# Patient Record
Sex: Male | Born: 1995 | Race: White | Hispanic: No | Marital: Single | Smoking: Never smoker
Health system: Southern US, Community
[De-identification: ages and names within clinical notes are randomized; demographics above are authoritative.]

## PROBLEM LIST (undated history)

## (undated) DIAGNOSIS — I471 Supraventricular tachycardia, unspecified: Secondary | ICD-10-CM

## (undated) DIAGNOSIS — F419 Anxiety disorder, unspecified: Secondary | ICD-10-CM

## (undated) DIAGNOSIS — I10 Essential (primary) hypertension: Secondary | ICD-10-CM

## (undated) HISTORY — PX: TONSILLECTOMY: SUR1361

## (undated) HISTORY — PX: APPENDECTOMY: SHX54

---
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
## 2015-09-27 ENCOUNTER — Emergency Department (HOSPITAL_COMMUNITY): Payer: BLUE CROSS/BLUE SHIELD

## 2015-09-27 ENCOUNTER — Emergency Department (HOSPITAL_COMMUNITY)
Admission: EM | Admit: 2015-09-27 | Discharge: 2015-09-27 | Disposition: A | Discharge status: Home / Self Care | Payer: BLUE CROSS/BLUE SHIELD | Attending: Emergency Medicine | Admitting: Emergency Medicine

## 2015-09-27 ENCOUNTER — Encounter (HOSPITAL_COMMUNITY): Payer: Self-pay | Admitting: *Deleted

## 2015-09-27 DIAGNOSIS — S0081XA Abrasion of other part of head, initial encounter: Secondary | ICD-10-CM | POA: Diagnosis not present

## 2015-09-27 DIAGNOSIS — S0990XA Unspecified injury of head, initial encounter: Secondary | ICD-10-CM | POA: Diagnosis present

## 2015-09-27 DIAGNOSIS — S8992XA Unspecified injury of left lower leg, initial encounter: Secondary | ICD-10-CM | POA: Insufficient documentation

## 2015-09-27 DIAGNOSIS — Y92410 Unspecified street and highway as the place of occurrence of the external cause: Secondary | ICD-10-CM | POA: Insufficient documentation

## 2015-09-27 DIAGNOSIS — Z8659 Personal history of other mental and behavioral disorders: Secondary | ICD-10-CM | POA: Insufficient documentation

## 2015-09-27 DIAGNOSIS — Z91040 Latex allergy status: Secondary | ICD-10-CM | POA: Diagnosis not present

## 2015-09-27 DIAGNOSIS — I10 Essential (primary) hypertension: Secondary | ICD-10-CM | POA: Insufficient documentation

## 2015-09-27 DIAGNOSIS — Y9389 Activity, other specified: Secondary | ICD-10-CM | POA: Diagnosis not present

## 2015-09-27 DIAGNOSIS — S1081XA Abrasion of other specified part of neck, initial encounter: Secondary | ICD-10-CM | POA: Insufficient documentation

## 2015-09-27 DIAGNOSIS — S060X1A Concussion with loss of consciousness of 30 minutes or less, initial encounter: Secondary | ICD-10-CM | POA: Diagnosis not present

## 2015-09-27 DIAGNOSIS — Y998 Other external cause status: Secondary | ICD-10-CM | POA: Diagnosis not present

## 2015-09-27 HISTORY — DX: Essential (primary) hypertension: I10

## 2015-09-27 HISTORY — DX: Anxiety disorder, unspecified: F41.9

## 2015-09-27 LAB — CBC WITH DIFFERENTIAL/PLATELET
Basophils Absolute: 0 10*3/uL (ref 0.0–0.1)
Basophils Relative: 0 %
EOS ABS: 0.1 10*3/uL (ref 0.0–0.7)
Eosinophils Relative: 1 %
HCT: 42.1 % (ref 39.0–52.0)
Hemoglobin: 14.8 g/dL (ref 13.0–17.0)
LYMPHS ABS: 2.1 10*3/uL (ref 0.7–4.0)
Lymphocytes Relative: 23 %
MCH: 32.5 pg (ref 26.0–34.0)
MCHC: 35.2 g/dL (ref 30.0–36.0)
MCV: 92.5 fL (ref 78.0–100.0)
MONOS PCT: 6 %
Monocytes Absolute: 0.5 10*3/uL (ref 0.1–1.0)
NEUTROS ABS: 6.3 10*3/uL (ref 1.7–7.7)
NEUTROS PCT: 70 %
PLATELETS: 242 10*3/uL (ref 150–400)
RBC: 4.55 MIL/uL (ref 4.22–5.81)
RDW: 11.9 % (ref 11.5–15.5)
WBC: 9 10*3/uL (ref 4.0–10.5)

## 2015-09-27 LAB — URINALYSIS, ROUTINE W REFLEX MICROSCOPIC
Bilirubin Urine: NEGATIVE
GLUCOSE, UA: NEGATIVE mg/dL
Hgb urine dipstick: NEGATIVE
KETONES UR: NEGATIVE mg/dL
LEUKOCYTES UA: NEGATIVE
Nitrite: NEGATIVE
PROTEIN: NEGATIVE mg/dL
Specific Gravity, Urine: 1.01 (ref 1.005–1.030)
pH: 6 (ref 5.0–8.0)

## 2015-09-27 LAB — COMPREHENSIVE METABOLIC PANEL
ALBUMIN: 4.1 g/dL (ref 3.5–5.0)
ALK PHOS: 51 U/L (ref 38–126)
ALT: 21 U/L (ref 17–63)
ANION GAP: 10 (ref 5–15)
AST: 24 U/L (ref 15–41)
BUN: 17 mg/dL (ref 6–20)
CHLORIDE: 103 mmol/L (ref 101–111)
CO2: 28 mmol/L (ref 22–32)
Calcium: 9.4 mg/dL (ref 8.9–10.3)
Creatinine, Ser: 1.22 mg/dL (ref 0.61–1.24)
GFR calc non Af Amer: >60 mL/min (ref >60)
GLUCOSE: 113 mg/dL — AB (ref 65–99)
POTASSIUM: 4.1 mmol/L (ref 3.5–5.1)
SODIUM: 141 mmol/L (ref 135–145)
Total Bilirubin: 0.5 mg/dL (ref 0.3–1.2)
Total Protein: 6.8 g/dL (ref 6.5–8.1)

## 2015-09-27 LAB — LIPASE, BLOOD: Lipase: 29 U/L (ref 11–51)

## 2015-09-27 MED ORDER — ONDANSETRON 4 MG PO TBDP: 4.0000 mg | ORAL_TABLET | Freq: Three times a day (TID) | ORAL | Status: AC | PRN

## 2015-09-27 MED ORDER — ACETAMINOPHEN 325 MG PO TABS
650.0000 mg | ORAL_TABLET | Freq: Four times a day (QID) | ORAL | Status: DC | PRN
  Administered 2015-09-27: 650 mg via ORAL
  Filled 2015-09-27: qty 2

## 2015-09-27 MED ORDER — ONDANSETRON HCL 4 MG/2ML IJ SOLN
4.0000 mg | Freq: Once | INTRAMUSCULAR | Status: AC
  Administered 2015-09-27: 4 mg via INTRAVENOUS
  Filled 2015-09-27: qty 2

## 2015-09-27 MED ORDER — HYDROCODONE-ACETAMINOPHEN 5-325 MG PO TABS: 1.0000 | ORAL_TABLET | Freq: Once | ORAL | Status: DC
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
## 2015-09-27 NOTE — ED Notes (Signed)
Dr. Little at the bedside. 

## 2015-09-27 NOTE — ED Notes (Signed)
Dr Clarene Duke removed C Collar

## 2015-09-27 NOTE — ED Notes (Signed)
Discharge instructions and prescription reviewed - voiced understanding.  

## 2015-09-27 NOTE — Discharge Instructions (Signed)
Concussion, Adult  A concussion, or closed-head injury, is a brain injury caused by a direct blow to the head or by a quick and sudden movement (jolt) of the head or neck. Concussions are usually not life-threatening. Even so, the effects of a concussion can be serious. If you have had a concussion before, you are more likely to experience concussion-like symptoms after a direct blow to the head.   CAUSES  · Direct blow to the head, such as from running into another player during a soccer game, being hit in a fight, or hitting your head on a hard surface.  · A jolt of the head or neck that causes the brain to move back and forth inside the skull, such as in a car crash.  SIGNS AND SYMPTOMS  The signs of a concussion can be hard to notice. Early on, they may be missed by you, family members, and health care providers. You may look fine but act or feel differently.  Symptoms are usually temporary, but they may last for days, weeks, or even longer. Some symptoms may appear right away while others may not show up for hours or days. Every head injury is different. Symptoms include:  · Mild to moderate headaches that will not go away.  · A feeling of pressure inside your head.  · Having more trouble than usual:    Learning or remembering things you have heard.    Answering questions.    Paying attention or concentrating.    Organizing daily tasks.    Making decisions and solving problems.  · Slowness in thinking, acting or reacting, speaking, or reading.  · Getting lost or being easily confused.  · Feeling tired all the time or lacking energy (fatigued).  · Feeling drowsy.  · Sleep disturbances.    Sleeping more than usual.    Sleeping less than usual.    Trouble falling asleep.    Trouble sleeping (insomnia).  · Loss of balance or feeling lightheaded or dizzy.  · Nausea or vomiting.  · Numbness or tingling.  · Increased sensitivity to:    Sounds.    Lights.    Distractions.  · Vision problems or eyes that tire  easily.  · Diminished sense of taste or smell.  · Ringing in the ears.  · Mood changes such as feeling sad or anxious.  · Becoming easily irritated or angry for Cimone Fahey or no reason.  · Lack of motivation.  · Seeing or hearing things other people do not see or hear (hallucinations).  DIAGNOSIS  Your health care provider can usually diagnose a concussion based on a description of your injury and symptoms. He or she will ask whether you passed out (lost consciousness) and whether you are having trouble remembering events that happened right before and during your injury.  Your evaluation might include:  · A brain scan to look for signs of injury to the brain. Even if the test shows no injury, you may still have a concussion.  · Blood tests to be sure other problems are not present.  TREATMENT  · Concussions are usually treated in an emergency department, in urgent care, or at a clinic. You may need to stay in the hospital overnight for further treatment.  · Tell your health care provider if you are taking any medicines, including prescription medicines, over-the-counter medicines, and natural remedies. Some medicines, such as blood thinners (anticoagulants) and aspirin, may increase the chance of complications. Also tell your health care   provider whether you have had alcohol or are taking illegal drugs. This information may affect treatment.  · Your health care provider will send you home with important instructions to follow.  · How fast you will recover from a concussion depends on many factors. These factors include how severe your concussion is, what part of your brain was injured, your age, and how healthy you were before the concussion.  · Most people with mild injuries recover fully. Recovery can take time. In general, recovery is slower in older persons. Also, persons who have had a concussion in the past or have other medical problems may find that it takes longer to recover from their current injury.  HOME  CARE INSTRUCTIONS  General Instructions  · Carefully follow the directions your health care provider gave you.  · Only take over-the-counter or prescription medicines for pain, discomfort, or fever as directed by your health care provider.  · Take only those medicines that your health care provider has approved.  · Do not drink alcohol until your health care provider says you are well enough to do so. Alcohol and certain other drugs may slow your recovery and can put you at risk of further injury.  · If it is harder than usual to remember things, write them down.  · If you are easily distracted, try to do one thing at a time. For example, do not try to watch TV while fixing dinner.  · Talk with family members or close friends when making important decisions.  · Keep all follow-up appointments. Repeated evaluation of your symptoms is recommended for your recovery.  · Watch your symptoms and tell others to do the same. Complications sometimes occur after a concussion. Older adults with a brain injury may have a higher risk of serious complications, such as a blood clot on the brain.  · Tell your teachers, school nurse, school counselor, coach, athletic trainer, or work manager about your injury, symptoms, and restrictions. Tell them about what you can or cannot do. They should watch for:    Increased problems with attention or concentration.    Increased difficulty remembering or learning new information.    Increased time needed to complete tasks or assignments.    Increased irritability or decreased ability to cope with stress.    Increased symptoms.  · Rest. Rest helps the brain to heal. Make sure you:    Get plenty of sleep at night. Avoid staying up late at night.    Keep the same bedtime hours on weekends and weekdays.    Rest during the day. Take daytime naps or rest breaks when you feel tired.  · Limit activities that require a lot of thought or concentration. These include:    Doing homework or job-related  work.    Watching TV.    Working on the computer.  · Avoid any situation where there is potential for another head injury (football, hockey, soccer, basketball, martial arts, downhill snow sports and horseback riding). Your condition will get worse every time you experience a concussion. You should avoid these activities until you are evaluated by the appropriate follow-up health care providers.  Returning To Your Regular Activities  You will need to return to your normal activities slowly, not all at once. You must give your body and brain enough time for recovery.  · Do not return to sports or other athletic activities until your health care provider tells you it is safe to do so.  · Ask   your health care provider when you can drive, ride a bicycle, or operate heavy machinery. Your ability to react may be slower after a brain injury. Never do these activities if you are dizzy.  · Ask your health care provider about when you can return to work or school.  Preventing Another Concussion  It is very important to avoid another brain injury, especially before you have recovered. In rare cases, another injury can lead to permanent brain damage, brain swelling, or death. The risk of this is greatest during the first 7-10 days after a head injury. Avoid injuries by:  · Wearing a seat belt when riding in a car.  · Drinking alcohol only in moderation.  · Wearing a helmet when biking, skiing, skateboarding, skating, or doing similar activities.  · Avoiding activities that could lead to a second concussion, such as contact or recreational sports, until your health care provider says it is okay.  · Taking safety measures in your home.    Remove clutter and tripping hazards from floors and stairways.    Use grab bars in bathrooms and handrails by stairs.    Place non-slip mats on floors and in bathtubs.    Improve lighting in dim areas.  SEEK MEDICAL CARE IF:  · You have increased problems paying attention or  concentrating.  · You have increased difficulty remembering or learning new information.  · You need more time to complete tasks or assignments than before.  · You have increased irritability or decreased ability to cope with stress.  · You have more symptoms than before.  Seek medical care if you have any of the following symptoms for more than 2 weeks after your injury:  · Lasting (chronic) headaches.  · Dizziness or balance problems.  · Nausea.  · Vision problems.  · Increased sensitivity to noise or light.  · Depression or mood swings.  · Anxiety or irritability.  · Memory problems.  · Difficulty concentrating or paying attention.  · Sleep problems.  · Feeling tired all the time.  SEEK IMMEDIATE MEDICAL CARE IF:  · You have severe or worsening headaches. These may be a sign of a blood clot in the brain.  · You have weakness (even if only in one hand, leg, or part of the face).  · You have numbness.  · You have decreased coordination.  · You vomit repeatedly.  · You have increased sleepiness.  · One pupil is larger than the other.  · You have convulsions.  · You have slurred speech.  · You have increased confusion. This may be a sign of a blood clot in the brain.  · You have increased restlessness, agitation, or irritability.  · You are unable to recognize people or places.  · You have neck pain.  · It is difficult to wake you up.  · You have unusual behavior changes.  · You lose consciousness.  MAKE SURE YOU:  · Understand these instructions.  · Will watch your condition.  · Will get help right away if you are not doing well or get worse.     This information is not intended to replace advice given to you by your health care provider. Make sure you discuss any questions you have with your health care provider.     Document Released: 10/27/2003 Document Revised: 08/27/2014 Document Reviewed: 02/26/2013  Elsevier Interactive Patient Education ©2016 [employer]

## 2015-10-03 ENCOUNTER — Emergency Department (HOSPITAL_COMMUNITY): Admission: EM | Admit: 2015-10-03 | Payer: No Typology Code available for payment source | Source: Home / Self Care

## 2015-10-03 NOTE — ED Notes (Signed)
Patient's mother states that due to extended wait times she will take him elsewhere
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
## 2018-04-06 ENCOUNTER — Other Ambulatory Visit: Payer: Self-pay

## 2018-04-06 ENCOUNTER — Emergency Department (HOSPITAL_BASED_OUTPATIENT_CLINIC_OR_DEPARTMENT_OTHER)
Admission: EM | Admit: 2018-04-06 | Discharge: 2018-04-06 | Disposition: A | Discharge status: Home / Self Care | Payer: BC Managed Care – PPO | Attending: Emergency Medicine | Admitting: Emergency Medicine

## 2018-04-06 ENCOUNTER — Encounter (HOSPITAL_BASED_OUTPATIENT_CLINIC_OR_DEPARTMENT_OTHER): Payer: Self-pay | Admitting: *Deleted

## 2018-04-06 DIAGNOSIS — R5383 Other fatigue: Secondary | ICD-10-CM | POA: Insufficient documentation

## 2018-04-06 DIAGNOSIS — R0602 Shortness of breath: Secondary | ICD-10-CM | POA: Insufficient documentation

## 2018-04-06 DIAGNOSIS — I10 Essential (primary) hypertension: Secondary | ICD-10-CM | POA: Insufficient documentation

## 2018-04-06 DIAGNOSIS — R002 Palpitations: Secondary | ICD-10-CM | POA: Insufficient documentation

## 2018-04-06 HISTORY — DX: Supraventricular tachycardia: I47.1

## 2018-04-06 HISTORY — DX: Supraventricular tachycardia, unspecified: I47.10

## 2018-04-06 LAB — BASIC METABOLIC PANEL
Anion gap: 10 (ref 5–15)
BUN: 20 mg/dL (ref 6–20)
CALCIUM: 9.2 mg/dL (ref 8.9–10.3)
CO2: 26 mmol/L (ref 22–32)
CREATININE: 1.02 mg/dL (ref 0.61–1.24)
Chloride: 104 mmol/L (ref 98–111)
GFR calc non Af Amer: >60 mL/min (ref >60)
GLUCOSE: 95 mg/dL (ref 70–99)
Potassium: 3.5 mmol/L (ref 3.5–5.1)
Sodium: 140 mmol/L (ref 135–145)

## 2018-04-06 LAB — CBC
HEMATOCRIT: 41.1 % (ref 39.0–52.0)
Hemoglobin: 14.8 g/dL (ref 13.0–17.0)
MCH: 32.8 pg (ref 26.0–34.0)
MCHC: 36 g/dL (ref 30.0–36.0)
MCV: 91.1 fL (ref 78.0–100.0)
Platelets: 282 10*3/uL (ref 150–400)
RBC: 4.51 MIL/uL (ref 4.22–5.81)
RDW: 11.6 % (ref 11.5–15.5)
WBC: 9.5 10*3/uL (ref 4.0–10.5)

## 2018-04-06 LAB — TROPONIN I: Troponin I: <0.03 ng/mL (ref <0.03)

## 2018-04-06 MED ORDER — METOPROLOL SUCCINATE ER 25 MG PO TB24: 25.0000 mg | ORAL_TABLET | Freq: Every day | ORAL | 0 refills | Status: AC

## 2018-04-06 NOTE — ED Triage Notes (Signed)
Pt reports hx of SVT-states that he feels like he is having episodes again. Reports SOB.  No acute distress noted. Ambulatory.

## 2018-04-06 NOTE — Discharge Instructions (Signed)
Please read and follow all provided instructions.  Your diagnoses today include:  1. Palpitations     Tests performed today include:  An EKG of your heart  A chest x-ray  Cardiac enzymes - a blood test for heart muscle damage  Blood counts and electrolytes  Vital signs. See below for your results today.   Medications prescribed:   Toprol - medication to control heart rate.   Take any prescribed medications only as directed.  Follow-up instructions: Please follow-up with your primary care provider and cardiologist for further discussion of your symptoms.   Return instructions:  SEEK IMMEDIATE MEDICAL ATTENTION IF:  You have severe chest pain, especially if the pain is crushing or pressure-like and spreads to the arms, back, neck, or jaw, or if you have sweating, nausea (feeling sick to your stomach), or shortness of breath. THIS IS AN EMERGENCY. Don't wait to see if the pain will go away. Get medical help at once. Call 911 or [phone] (operator). DO NOT drive yourself to the hospital.   Your chest pain gets worse and does not go away with rest.   You have an attack of chest pain lasting longer than usual, despite rest and treatment with the medications your caregiver has prescribed.   You wake from sleep with chest pain or shortness of breath.  You feel dizzy or faint.  You have chest pain not typical of your usual pain for which you originally saw your caregiver.   You have any other emergent concerns regarding your health.  Additional Information: Chest pain comes from many different causes. Your caregiver has diagnosed you as having chest pain that is not specific for one problem, but does not require admission.  You are at low risk for an acute heart condition or other serious illness.   Your vital signs today were: BP 130/79 (BP Location: Right Arm)    Pulse 69    Temp 98.7 F (37.1 C) (Oral)    Resp 18    Ht 5\' 10"  (1.778 m)    Wt 122.5 kg    SpO2 98%    BMI 38.74 kg/m   If your blood pressure (BP) was elevated above 135/85 this visit, please have this repeated by your doctor within one month. --------------

## 2018-04-07 NOTE — ED Provider Notes (Signed)
[facility] EMERGENCY DEPARTMENT Provider Note   CSN: [id number] Arrival date & time: 04/06/18  2017     History   Chief Complaint Chief Complaint  Patient presents with  . Palpitations    HPI Jesus Cobb is a 22 y.o. male.  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  Patient feels like he is having paroxysmal SVT episodes and mother states that the symptoms were how he presented when he was a child.  No lightheadedness or syncope.  Patient is not on any medications for SVT.  He denies excessive alcohol or caffeine use.  No chest pain.  Patient states he is under some stress as he is starting a new job next week.  Patient also worried because there is a very strong family history of cardiac disease on his father's side.  His last echo was a few years ago, at least 3 years ago, and patient has not seen a cardiologist since that time.  He reportedly had pericarditis about that time as well.  No recent viral symptoms.  Symptoms are improved at time of ED visit.  Nothing makes symptoms better or worse.     Past Medical History:  Diagnosis Date  . Anxiety   . Hypertension   . SVT (supraventricular tachycardia) (HCC)     There are no active problems to display for this patient.   Past Surgical History:  Procedure Laterality Date  . APPENDECTOMY    . TONSILLECTOMY          Home Medications    Prior to Admission medications   Medication Sig Start Date End Date Taking? Authorizing Provider  amphetamine-dextroamphetamine (ADDERALL XR) 25 MG 24 hr capsule Take 25 mg by mouth daily as needed. ADHD    [provider]  metoprolol succinate (TOPROL-XL) 25 MG 24 hr tablet Take 1 tablet (25 mg total) by mouth daily. 04/06/18   Renne CriglerGeiple, Aylani Spurlock, PA-C  ondansetron (ZOFRAN ODT) 4 MG  disintegrating tablet Take 1 tablet (4 mg total) by mouth every 8 (eight) hours as needed for nausea or vomiting. 09/27/15   Little, Ambrose Finlandachel Morgan, MD    Family History History reviewed. No pertinent family history.  Social History Social History   Tobacco Use  . Smoking status: Never Smoker  . Smokeless tobacco: Never Used  Substance Use Topics  . Alcohol use: Yes    Comment: ocassionally  . Drug use: No     Allergies   Latex   Review of Systems Review of Systems  Constitutional: Positive for fatigue. Negative for diaphoresis and fever.  Eyes: Negative for redness.  Respiratory: Positive for shortness of breath. Negative for cough.   Cardiovascular: Positive for chest pain and palpitations. Negative for leg swelling.  Gastrointestinal: Negative for abdominal pain, nausea and vomiting.  Genitourinary: Negative for dysuria.  Musculoskeletal: Negative for back pain and neck pain.  Skin: Negative for rash.  Neurological: Negative for syncope and light-headedness.  Psychiatric/Behavioral: The patient is not nervous/anxious.      Physical Exam Updated Vital Signs BP 122/74   Pulse 78   Temp 98.7 F (37.1 C) (Oral)   Resp 16   Ht 5\' 10"  (1.778 m)   Wt 122.5 kg   SpO2 97%   BMI 38.74 kg/m   Physical Exam  Constitutional: He appears well-developed and well-nourished.  HENT:  Head: Normocephalic and atraumatic.  Mouth/Throat: Mucous membranes are normal. Mucous membranes are not dry.  Eyes: Conjunctivae are normal.  Neck: Trachea normal and normal range of motion. Neck supple. Normal carotid pulses and no JVD present. No muscular tenderness present. Carotid bruit is not present. No tracheal deviation present.  Cardiovascular: Normal rate, regular rhythm, S1 normal, S2 normal, normal heart sounds and intact distal pulses. Exam reveals no distant heart sounds and no decreased pulses.  No murmur heard. Pulmonary/Chest: Effort normal and breath sounds normal. No  respiratory distress. He has no wheezes. He exhibits no tenderness.  Abdominal: Soft. Normal aorta and bowel sounds are normal. There is no tenderness. There is no rebound and no guarding.  Musculoskeletal: He exhibits no edema.  Neurological: He is alert.  Skin: Skin is warm and dry. He is not diaphoretic. No cyanosis. No pallor.  Psychiatric: He has a normal mood and affect.  Nursing note and vitals reviewed.    ED Treatments / Results  Labs (all labs ordered are listed, but only abnormal results are displayed) Labs Reviewed  CBC  BASIC METABOLIC PANEL  TROPONIN I    ED ECG REPORT   Date: 04/07/2018  Rate: 89  Rhythm: normal sinus rhythm  QRS Axis: normal  Intervals: normal  ST/T Wave abnormalities: normal  Conduction Disutrbances:none  Narrative Interpretation:   Old EKG Reviewed: none available  I have personally reviewed the EKG tracing and agree with the computerized printout as noted.  Radiology No results found.  Procedures Procedures (including critical care time)  Medications Ordered in ED Medications - No data to display   Initial Impression / Assessment and Plan / ED Course  I have reviewed the triage vital signs and the nursing notes.  Pertinent labs & imaging results that were available during my care of the patient were reviewed by me and considered in my medical decision making (see chart for details).     Patient seen and examined.  Discussed EKG results.  Patient has not seem to have any runs of SVT here.  We will proceed with lab work.  If negative, patient is interested in a low-dose beta-blocker to take until he can follow-up with a cardiologist.  Vital signs reviewed and are as follows: BP 122/74   Pulse 78   Temp 98.7 F (37.1 C) (Oral)   Resp 16   Ht 5\' 10"  (1.778 m)   Wt 122.5 kg   SpO2 97%   BMI 38.74 kg/m   Lab work is completely normal.  Patient stable.  He states that he is actually feeling better since he has been able to  relax in the room.  Feel comfortable discharged home at this time.  Will start patient on a very small dose of Toprol-XL.  Discussed possible side effects of lightheadedness and dizziness and that he should discontinue if these occur.  He will follow-up with a cardiologist, referrals given.  Also PCP referrals given.  Patient recently moved back to the area.  Final Clinical Impressions(s) / ED Diagnoses   Final diagnoses:  Palpitations   Patient with previous history of SVT with symptoms similar to when he had short-lived SVT episodes in the past.  No evidence of this here in the emergency department.  EKG is reassuring without signs of prolonged QT, Brugada syndrome, reentrant tachycardia, LVH, or other arrhythmogenic patterns.  Lab work including troponin is negative.  Patient has done well here.  Treatment as above.  Patient seems motivated to follow-up with  both a cardiologist and primary care physician for further management.  ED Discharge Orders         Ordered    metoprolol succinate (TOPROL-XL) 25 MG 24 hr tablet  Daily     04/06/18 2337           Renne Crigler, PA-C 04/07/18 0015    Arby Barrette, MD 04/07/18 1327

## 2019-10-02 ENCOUNTER — Ambulatory Visit: Payer: BC Managed Care – PPO

## 2024-01-08 ENCOUNTER — Other Ambulatory Visit: Payer: Self-pay

## 2024-01-08 ENCOUNTER — Ambulatory Visit
Admission: EM | Admit: 2024-01-08 | Discharge: 2024-01-08 | Disposition: A | Discharge status: Home / Self Care | Attending: Physician Assistant | Admitting: Physician Assistant

## 2024-01-08 DIAGNOSIS — S61214A Laceration without foreign body of right ring finger without damage to nail, initial encounter: Secondary | ICD-10-CM

## 2024-01-08 MED ORDER — LIDOCAINE-EPINEPHRINE-TETRACAINE (LET) TOPICAL GEL
3.0000 mL | Freq: Once | TOPICAL | Status: AC
  Administered 2024-01-08: 3 mL via TOPICAL

## 2024-01-08 NOTE — Discharge Instructions (Addendum)
 You were seen today for a laceration of your right ring finger. You reported that your last tetanus booster was administered in 2018 which is still within the 10-year effective.  So we did not administer a booster today.  We did apply a medication to numb the area and then we provided wound cleansing and then a closure with a substance called Dermabond.  This substance will naturally come off of the wound as it closes and should protect the area from contamination.  If desired you can use an adhesive bandage to cover the area and protect for further contamination or injury per your preference.  Please refrain from submerging the wound for prolonged periods of time over the next week or until the Dermabond has completely come off.  If at any point you start to notice drainage that looks like pus, swelling, inability to bend your finger or swelling and redness that extends into your hand please return here or go to the emergency room for further evaluation

## 2024-01-08 NOTE — ED Triage Notes (Addendum)
 Pt presents with complaints of laceration to right ring finger today at noon after reaching down in the seat of his car. Pt currently rates his overall pain a 4/10. "Antibiotic ointment in a emergency kit applied", wrapped with Band-Aid. Bleeding is currently controlled. Last tetanus was in 2018 per pt description.

## 2024-01-08 NOTE — ED Notes (Signed)
 This RN covered patient's right ring finger with nonadherent pad, wrapped with brown guaze.

## 2024-01-08 NOTE — ED Provider Notes (Signed)
 Jesus Cobb    CSN: [id number] Arrival date & time: 01/08/24  1257      History   Chief Complaint Chief Complaint  Patient presents with   Finger Laceration     HPI Jesus Cobb is a 28 y.o. male.   HPI  Patient presents today with concerns for laceration to right ring finger that occurred earlier today at about noon He states laceration occurred while he was trying to adjust his seat and he cut it on piece of metal  He reports trying to apply pressure and using neosporin and bandage but bleeding did not seem to slow down  He report current throbbing and itching  He was not able to wash or irrigate the area when injury occurred   His last tetanus booster was July 2018 -per patient report   Past Medical History:  Diagnosis Date   Anxiety    Hypertension    SVT (supraventricular tachycardia) (HCC)     There are no active problems to display for this patient.   Past Surgical History:  Procedure Laterality Date   APPENDECTOMY     TONSILLECTOMY         Home Medications    Prior to Admission medications   Medication Sig Start Date End Date Taking? Authorizing Provider  amphetamine-dextroamphetamine (ADDERALL XR) 25 MG 24 hr capsule Take 25 mg by mouth daily as needed. ADHD    [provider]  metoprolol  succinate (TOPROL -XL) 25 MG 24 hr tablet Take 1 tablet (25 mg total) by mouth daily. 04/06/18   Lyna Sandhoff, PA-C  ondansetron  (ZOFRAN  ODT) 4 MG disintegrating tablet Take 1 tablet (4 mg total) by mouth every 8 (eight) hours as needed for nausea or vomiting. 09/27/15   Little, Hebert Littler, MD    Family History History reviewed. No pertinent family history.  Social History Social History   Tobacco Use   Smoking status: Never   Smokeless tobacco: Never  Vaping Use   Vaping status: Every Day   Devices: "only socially"  Substance Use Topics   Alcohol use: Yes    Comment: ocassionally   Drug use: No     Allergies    Latex   Review of Systems Review of Systems  Skin:  Positive for wound.     Physical Exam Triage Vital Signs ED Triage Vitals  Encounter Vitals Group     BP 01/08/24 1310 129/87     Systolic BP Percentile --      Diastolic BP Percentile --      Pulse Rate 01/08/24 1310 83     Resp 01/08/24 1310 18     Temp 01/08/24 1310 98 F (36.7 C)     Temp Source 01/08/24 1310 Oral     SpO2 01/08/24 1310 95 %     Weight 01/08/24 1311 (!) 305 lb (138.3 kg)     Height 01/08/24 1311 5\' 10"  (1.778 m)     Head Circumference --      Peak Flow --      Pain Score 01/08/24 1311 4     Pain Loc --      Pain Education --      Exclude from Growth Chart --    No data found.  Updated Vital Signs BP 129/87 (BP Location: Right Arm)   Pulse 83   Temp 98 F (36.7 C) (Oral)   Resp 18   Ht 5\' 10"  (1.778 m)   Wt (!) 305 lb (138.3 kg)  SpO2 95%   BMI 43.76 kg/m   Visual Acuity Right Eye Distance:   Left Eye Distance:   Bilateral Distance:    Right Eye Near:   Left Eye Near:    Bilateral Near:     Physical Exam Vitals reviewed.  Constitutional:      General: He is awake. He is not in acute distress.    Appearance: Normal appearance. He is well-developed and well-groomed. He is not ill-appearing or toxic-appearing.     Comments: Patient is a pleasant 28 year old male who appears stated age.  He has a bandage on his right ring finger around the laceration and does not appear to be in acute distress at time of initial evaluation.  HENT:     Head: Normocephalic and atraumatic.  Skin:    Findings: Laceration present.     Comments: Patient has approximately 1 cm long, superficial laceration to the volar aspect of the right ring finger at the PIP joint  Neurological:     Mental Status: He is alert.  Psychiatric:        Behavior: Behavior is cooperative.      Cobb Treatments / Results  Labs (all labs ordered are listed, but only abnormal results are displayed) Labs Reviewed - No data  to display  EKG   Radiology No results found.  Procedures Laceration Repair  Date/Time: 01/08/2024 1:36 PM  Performed by: Jesus Mooring, PA-C Authorized by: Jesus Cobb E, PA-C   Consent:    Consent obtained:  Verbal   Consent given by:  Patient   Risks, benefits, and alternatives were discussed: yes     Risks discussed:  Infection, pain, poor cosmetic result, need for additional repair and poor wound healing   Alternatives discussed:  No treatment, delayed treatment and observation Universal protocol:    Procedure explained and questions answered to patient or proxy's satisfaction: yes     Relevant documents present and verified: yes     Patient identity confirmed:  Verbally with patient Anesthesia:    Anesthesia method:  Topical application   Topical anesthetic:  LET Laceration details:    Location:  Finger   Finger location:  R ring finger   Length (cm):  1   Depth (mm):  2 Pre-procedure details:    Preparation:  Patient was prepped and draped in usual sterile fashion Exploration:    Hemostasis achieved with:  LET and direct pressure Treatment:    Area cleansed with:  Chlorhexidine and soap and water   Amount of cleaning:  Standard   Irrigation solution:  Tap water   Irrigation method:  Tap   Debridement:  None   Undermining:  None Skin repair:    Repair method:  Tissue adhesive Approximation:    Approximation:  Close Repair type:    Repair type:  Simple Post-procedure details:    Dressing:  Adhesive bandage   Procedure completion:  Tolerated  (including critical care time)  Medications Ordered in Cobb Medications  lidocaine-EPINEPHrine-tetracaine (LET) topical gel (3 mLs Topical Given 01/08/24 1338)    Initial Impression / Assessment and Plan / Cobb Course  I have reviewed the triage vital signs and the nursing notes.  Pertinent labs & imaging results that were available during my care of the patient were reviewed by me and considered in my medical  decision making (see chart for details).      Final Clinical Impressions(s) / Cobb Diagnoses   Final diagnoses:  Laceration of right ring finger without foreign  body without damage to nail, initial encounter   Patient presents today with concerns for laceration to the right ring finger that occurred earlier this morning.  Laceration is approximately 1 cm in length and overall superficial without notable fat involvement or tendon damage as patient has intact range of motion and capillary refill is less than 2 seconds.  Wound was cleansed and repaired per the procedure note above.  Patient tolerated this without issue.  Patient reports that his most recent tetanus booster was administered in 2018 which is within 10-year timeframe.  Reviewed home measures with patient for keeping area clean, dry and pain management as needed.  ED and return precautions reviewed and provided in after visit summary.  Follow-up as needed.    Discharge Instructions      You were seen today for a laceration of your right ring finger. You reported that your last tetanus booster was administered in 2018 which is still within the 10-year effective.  So we did not administer a booster today.  We did apply a medication to numb the area and then we provided wound cleansing and then a closure with a substance called Dermabond.  This substance will naturally come off of the wound as it closes and should protect the area from contamination.  If desired you can use an adhesive bandage to cover the area and protect for further contamination or injury per your preference.  Please refrain from submerging the wound for prolonged periods of time over the next week or until the Dermabond has completely come off.  If at any point you start to notice drainage that looks like pus, swelling, inability to bend your finger or swelling and redness that extends into your hand please return here or go to the emergency room for further  evaluation   ED Prescriptions   None    PDMP not reviewed this encounter.   Tiauna Whisnant, Pearla Bottom, PA-C 01/10/24 1204

## 2024-04-02 ENCOUNTER — Other Ambulatory Visit: Payer: Self-pay

## 2024-04-02 ENCOUNTER — Ambulatory Visit
Admission: EM | Admit: 2024-04-02 | Discharge: 2024-04-02 | Disposition: A | Discharge status: Home / Self Care | Payer: Self-pay | Attending: Physician Assistant | Admitting: Physician Assistant

## 2024-04-02 DIAGNOSIS — R0989 Other specified symptoms and signs involving the circulatory and respiratory systems: Secondary | ICD-10-CM

## 2024-04-02 LAB — POCT RAPID STREP A (OFFICE): Rapid Strep A Screen: NEGATIVE

## 2024-04-02 LAB — POC COVID19/FLU A&B COMBO
Covid Antigen, POC: NEGATIVE
Influenza A Antigen, POC: NEGATIVE
Influenza B Antigen, POC: NEGATIVE

## 2024-04-02 NOTE — Discharge Instructions (Addendum)

## 2024-04-02 NOTE — ED Triage Notes (Addendum)
 Pt presents with complaints of sore throat, headaches, n/v/d, nasal congestion, cough, dizziness, body chills, hot flashes, and mild body aches. Symptoms are worse at nighttime, have been ongoing for two days. No fevers at home. OTC Cold & Flu taken with some improvement in symptoms. Currently denies pain, states he feels overall uncomfortable. SOB on exertion. At-home COVID test taken yesterday and it was negative.

## 2024-04-02 NOTE — ED Provider Notes (Signed)
 [facility]    CSN: [id number] Arrival date & time: 04/02/24  1429      History   Chief Complaint Chief Complaint  Patient presents with   Sore Throat   Headache    HPI Jesus Cobb is a 28 y.o. male.   HPI  Pt is here today with concerns for upper respiratory symptoms that have been ongoing since Tuesday afternoon. He states he is having sore throat, nasal congestion, productive coughing with green mucus, body aches, headaches, nausea, vomiting and diarrhea.  He reports he has vomited a few times in the AM - usually when he is getting in or out of shower and needs to get mucus up Interventions: he has taken nighttime cold and flu last night and then started day time formulation today  He reports pain and lymphadenopathy as well He states sense of taste is altered and seems like things are metallic  He denies similar symptoms in other members of household. He denies recent travel.    Past Medical History:  Diagnosis Date   Anxiety    Hypertension    SVT (supraventricular tachycardia) (HCC)     There are no active problems to display for this patient.   Past Surgical History:  Procedure Laterality Date   APPENDECTOMY     TONSILLECTOMY         Home Medications    Prior to Admission medications   Medication Sig Start Date End Date Taking? Authorizing Provider  atomoxetine (STRATTERA) 25 MG capsule Take 25 mg by mouth. 06/17/23  Yes [provider]  amphetamine-dextroamphetamine (ADDERALL XR) 25 MG 24 hr capsule Take 25 mg by mouth daily as needed. ADHD    [provider]  metoprolol  succinate (TOPROL -XL) 25 MG 24 hr tablet Take 1 tablet (25 mg total) by mouth daily. 04/06/18   Desiderio Chew, PA-C  ondansetron  (ZOFRAN  ODT) 4 MG disintegrating tablet Take 1 tablet (4 mg total) by mouth every 8 (eight) hours as needed for nausea or vomiting. 09/27/15   Little, Vernell Search, MD    Family History History reviewed. No pertinent family  history.  Social History Social History   Tobacco Use   Smoking status: Never   Smokeless tobacco: Never  Vaping Use   Vaping status: Every Day   Devices: only socially  Substance Use Topics   Alcohol use: Yes    Comment: ocassionally   Drug use: No     Allergies   Latex   Review of Systems Review of Systems  Constitutional:  Positive for chills, diaphoresis and fatigue.  HENT:  Positive for congestion, rhinorrhea and sore throat. Negative for ear pain, sinus pressure and sinus pain.   Respiratory:  Positive for cough. Negative for shortness of breath and wheezing.   Gastrointestinal:  Positive for diarrhea, nausea and vomiting.  Musculoskeletal:  Positive for myalgias.  Neurological:  Positive for headaches.     Physical Exam Triage Vital Signs ED Triage Vitals  Encounter Vitals Group     BP 04/02/24 1453 127/85     Girls Systolic BP Percentile --      Girls Diastolic BP Percentile --      Boys Systolic BP Percentile --      Boys Diastolic BP Percentile --      Pulse Rate 04/02/24 1453 71     Resp 04/02/24 1453 18     Temp 04/02/24 1453 98.2 F (36.8 C)     Temp Source 04/02/24 1453 Oral  SpO2 04/02/24 1453 97 %     Weight 04/02/24 1453 300 lb (136.1 kg)     Height 04/02/24 1453 5' 10 (1.778 m)     Head Circumference --      Peak Flow --      Pain Score 04/02/24 1510 0     Pain Loc --      Pain Education --      Exclude from Growth Chart --    No data found.  Updated Vital Signs BP 127/85 (BP Location: Right Arm)   Pulse 71   Temp 98.2 F (36.8 C) (Oral)   Resp 18   Ht 5' 10 (1.778 m)   Wt 300 lb (136.1 kg)   SpO2 97%   BMI 43.05 kg/m   Visual Acuity Right Eye Distance:   Left Eye Distance:   Bilateral Distance:    Right Eye Near:   Left Eye Near:    Bilateral Near:     Physical Exam Vitals reviewed.  Constitutional:      General: He is awake.     Appearance: Normal appearance. He is well-developed and well-groomed.  HENT:      Head: Normocephalic and atraumatic.     Right Ear: Hearing, tympanic membrane and ear canal normal.     Left Ear: Hearing, tympanic membrane and ear canal normal.     Mouth/Throat:     Lips: Pink.     Mouth: Mucous membranes are moist.     Pharynx: Oropharynx is clear. Uvula midline. Posterior oropharyngeal erythema and postnasal drip present. No pharyngeal swelling, oropharyngeal exudate or uvula swelling.     Tonsils: No tonsillar exudate or tonsillar abscesses.  Eyes:     General: Lids are normal. Gaze aligned appropriately.     Extraocular Movements: Extraocular movements intact.  Cardiovascular:     Rate and Rhythm: Normal rate and regular rhythm.     Heart sounds: Normal heart sounds.  Pulmonary:     Effort: Pulmonary effort is normal.     Breath sounds: Normal breath sounds. No decreased air movement. No decreased breath sounds, wheezing, rhonchi or rales.     Comments: Mildly decreased air movement in lower lung fields but no wheezes, rales, rhonchi  Musculoskeletal:     Cervical back: Normal range of motion and neck supple.  Lymphadenopathy:     Head:     Right side of head: No submental, submandibular or preauricular adenopathy.     Left side of head: No submental, submandibular or preauricular adenopathy.     Cervical:     Right cervical: No superficial cervical adenopathy.    Left cervical: No superficial cervical adenopathy.     Upper Body:     Right upper body: No supraclavicular adenopathy.     Left upper body: No supraclavicular adenopathy.  Skin:    General: Skin is warm and dry.  Neurological:     General: No focal deficit present.     Mental Status: He is alert and oriented to person, place, and time.  Psychiatric:        Mood and Affect: Mood normal.        Behavior: Behavior normal. Behavior is cooperative.        Thought Content: Thought content normal.        Judgment: Judgment normal.      UC Treatments / Results  Labs (all labs ordered are  listed, but only abnormal results are displayed) Labs Reviewed  POC COVID19/FLU A&B COMBO -  Normal  POCT RAPID STREP A (OFFICE) - Normal    EKG   Radiology No results found.  Procedures Procedures (including critical care time)  Medications Ordered in UC Medications - No data to display  Initial Impression / Assessment and Plan / UC Course  I have reviewed the triage vital signs and the nursing notes.  Pertinent labs & imaging results that were available during my care of the patient were reviewed by me and considered in my medical decision making (see chart for details).     Pt declines antiemetic medication script.   Final Clinical Impressions(s) / UC Diagnoses   Final diagnoses:  Symptoms of upper respiratory infection (URI)   Visit with patient indicates symptoms comprised of sore throat, sinus congestion, chills, body aches, nausea, vomiting, diarrhea since Tuesday congruent with acute URI that is likely viral in nature  Patient has tested negative for COVID, flu and strep in clinic today  Physical exam and vitals are largely reassuring and consistent with viral URI findings- mild pharyngeal erythema and postnasal drainage, slightly decreased air movement bilaterally in lung bases.  Due to nature and duration of symptoms recommended treatment regimen is symptomatic relief and follow up if needed Discussed with patient the various viral and bacterial etiologies of current illness and appropriate course of treatment Discussed OTC medication options for multisymptom relief such as Dayquil/Nyquil, Theraflu, AlkaSeltzer, etc. Discussed return precautions if symptoms are not improving or worsen over next 5-7 days.      Discharge Instructions      Based on your described symptoms and the duration of symptoms it is likely that you have a viral upper respiratory infection (often called a cold)  Symptoms can last for 3-10 days with lingering cough and intermittent  symptoms potentially  lasting several  weeks after that.  The goal of treatment at this time is to reduce your symptoms and discomfort   You can use the following medications and measures to help yourself feel better until your body fights this off: DayQuil/NyQuil, TheraFlu, Alka-Seltzer  (these medications typically have the same active ingredients in them so you can choose whichever one you prefer and take consistently during the day and night according to the manufactures instructions.) Flonase A daily antihistamine such as Zyrtec, Claritin, Allegra per your preference.  Please choose 1 and take consistently. Increased fluids.  It is recommended that you take in at least 64 ounces of water per day when you are not sick so it is important to increase this when you are sick and your body may be running fever. Rest Cough drops Chloraseptic throat spray to help with sore throat Nasal saline spray or nasal flushes to help with congestion and runny nose  If your symptoms seem like they are getting worse over the next 5 to 7 days or not improving you can always follow-up here in urgent care or go to your primary care provider for further management. Go to the ER if you begin to have more serious symptoms such as shortness of breath, trouble breathing, loss of consciousness, swelling around the eyes, high fever, severe lasting headaches, vision changes or neck pain/stiffness.       ED Prescriptions   None    PDMP not reviewed this encounter.   Marylene Rocky BRAVO, PA-C 04/02/24 1553

## 2024-05-07 ENCOUNTER — Ambulatory Visit: Admitting: Pediatrics

## 2024-05-25 ENCOUNTER — Ambulatory Visit: Admitting: Nurse Practitioner
